# Patient Record
Sex: Male | Born: 1990 | Race: White | Hispanic: No | Marital: Single | Smoking: Former smoker
Health system: Southern US, Community
[De-identification: ages and names within clinical notes are randomized; demographics above are authoritative.]

## PROBLEM LIST (undated history)

## (undated) DIAGNOSIS — K8020 Calculus of gallbladder without cholecystitis without obstruction: Secondary | ICD-10-CM

## (undated) DIAGNOSIS — G43909 Migraine, unspecified, not intractable, without status migrainosus: Secondary | ICD-10-CM

## (undated) DIAGNOSIS — Z87442 Personal history of urinary calculi: Secondary | ICD-10-CM

## (undated) DIAGNOSIS — J189 Pneumonia, unspecified organism: Secondary | ICD-10-CM

---
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
## 2010-10-04 ENCOUNTER — Emergency Department (HOSPITAL_COMMUNITY): Admission: EM | Admit: 2010-10-04 | Discharge: 2010-04-10 | Payer: Self-pay | Admitting: Emergency Medicine

## 2011-03-15 NOTE — Op Note (Signed)
[place]  Patient:    Ryan Wilkins, Ryan Wilkins                      MRN: [id number] Adm. Date:  [id number] Disc. Date: [id number] Attending:  Ellwood Handler CC:         Fonnie Mu, M.D.   Operative Report  PREOPERATIVE DIAGNOSIS:  Obstructive voiding symptoms and meatal stenosis.  POSTOPERATIVE DIAGNOSIS:  Obstructive voiding symptoms and meatal stenosis.  PROCEDURE:  Meatotomy and pediatric cystoscopy.  ANESTHESIA:  General.  DRAINS:  None.  COMPLICATIONS:  None.  DESCRIPTION OF PROCEDURE:  The patient was prepped and draped in the dorsal lithotomy position after institution of an adequate level of general anesthesia.  The urethral meatus was dilated using pediatric sounds at the very tip, wide enough to admit the pediatric cystoscope which was well lubricated and gently inserted at the urethral meatus.  Normal penile and bulbous urethra.  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  Membranous urethra was active and intact.  Juvenile prostatic urethra nonobstructive.  Normal trigonal anatomy.  Split-like orifices in the ______ position, a fluxing clear urine.  Bladder showed no evidence of urethral abnormality, tumor stone, or anatomic deformity.  Bladder was drained, and cystoscope was gently withdrawn.  A fine pediatric hemostat was then placed at the urethral meatus on the ventral aspect, left in place for five minutes, and then released and replaced with an adult hemostat which was left in place for five minutes and then replaced and replaced with a pediatric needle driver which was left in place for five minutes and then released.  A thin strip of avascular tissue was then created at the ventral aspect, incised gently with the ______ scissors.  Adequate urethral meatus had been created.  Covered with triple antibiotic  ointment and a Telfa sponge, and patient was returned to recovery in satisfactory condition. DD:  03/13/01 TD:  03/15/01 Job: [id number]

## 2017-05-12 DIAGNOSIS — J019 Acute sinusitis, unspecified: Secondary | ICD-10-CM | POA: Diagnosis not present

## 2017-05-12 DIAGNOSIS — Z6825 Body mass index (BMI) 25.0-25.9, adult: Secondary | ICD-10-CM | POA: Diagnosis not present

## 2018-10-09 DIAGNOSIS — R05 Cough: Secondary | ICD-10-CM | POA: Diagnosis not present

## 2018-10-09 DIAGNOSIS — Z87891 Personal history of nicotine dependence: Secondary | ICD-10-CM | POA: Diagnosis not present

## 2018-10-09 DIAGNOSIS — J209 Acute bronchitis, unspecified: Secondary | ICD-10-CM | POA: Diagnosis not present

## 2019-02-03 DIAGNOSIS — J329 Chronic sinusitis, unspecified: Secondary | ICD-10-CM | POA: Diagnosis not present

## 2019-02-03 DIAGNOSIS — R509 Fever, unspecified: Secondary | ICD-10-CM | POA: Diagnosis not present

## 2019-02-03 DIAGNOSIS — R05 Cough: Secondary | ICD-10-CM | POA: Diagnosis not present

## 2019-02-03 DIAGNOSIS — H6691 Otitis media, unspecified, right ear: Secondary | ICD-10-CM | POA: Diagnosis not present

## 2019-02-03 DIAGNOSIS — H1089 Other conjunctivitis: Secondary | ICD-10-CM | POA: Diagnosis not present

## 2019-02-09 DIAGNOSIS — R509 Fever, unspecified: Secondary | ICD-10-CM | POA: Diagnosis not present

## 2019-02-09 DIAGNOSIS — H6691 Otitis media, unspecified, right ear: Secondary | ICD-10-CM | POA: Diagnosis not present

## 2019-02-09 DIAGNOSIS — J019 Acute sinusitis, unspecified: Secondary | ICD-10-CM | POA: Diagnosis not present

## 2019-02-09 DIAGNOSIS — H1089 Other conjunctivitis: Secondary | ICD-10-CM | POA: Diagnosis not present

## 2019-12-31 ENCOUNTER — Emergency Department (HOSPITAL_BASED_OUTPATIENT_CLINIC_OR_DEPARTMENT_OTHER)
Admission: EM | Admit: 2019-12-31 | Discharge: 2019-12-31 | Disposition: A | Discharge status: Home / Self Care | Payer: BC Managed Care – PPO | Attending: Emergency Medicine | Admitting: Emergency Medicine

## 2019-12-31 ENCOUNTER — Other Ambulatory Visit: Payer: Self-pay

## 2019-12-31 ENCOUNTER — Encounter (HOSPITAL_BASED_OUTPATIENT_CLINIC_OR_DEPARTMENT_OTHER): Payer: Self-pay | Admitting: Emergency Medicine

## 2019-12-31 ENCOUNTER — Encounter: Payer: Self-pay | Admitting: Gastroenterology

## 2019-12-31 ENCOUNTER — Emergency Department (HOSPITAL_BASED_OUTPATIENT_CLINIC_OR_DEPARTMENT_OTHER): Payer: BC Managed Care – PPO

## 2019-12-31 DIAGNOSIS — R1011 Right upper quadrant pain: Secondary | ICD-10-CM | POA: Diagnosis not present

## 2019-12-31 DIAGNOSIS — K824 Cholesterolosis of gallbladder: Secondary | ICD-10-CM | POA: Diagnosis not present

## 2019-12-31 DIAGNOSIS — R112 Nausea with vomiting, unspecified: Secondary | ICD-10-CM | POA: Insufficient documentation

## 2019-12-31 DIAGNOSIS — Z87891 Personal history of nicotine dependence: Secondary | ICD-10-CM | POA: Insufficient documentation

## 2019-12-31 DIAGNOSIS — R509 Fever, unspecified: Secondary | ICD-10-CM | POA: Insufficient documentation

## 2019-12-31 HISTORY — DX: Migraine, unspecified, not intractable, without status migrainosus: G43.909

## 2019-12-31 LAB — URINALYSIS, ROUTINE W REFLEX MICROSCOPIC
Bilirubin Urine: NEGATIVE
Glucose, UA: NEGATIVE mg/dL
Hgb urine dipstick: NEGATIVE
Ketones, ur: NEGATIVE mg/dL
Leukocytes,Ua: NEGATIVE
Nitrite: NEGATIVE
Protein, ur: NEGATIVE mg/dL
Specific Gravity, Urine: 1.02 (ref 1.005–1.030)
pH: 8.5 — ABNORMAL HIGH (ref 5.0–8.0)

## 2019-12-31 LAB — COMPREHENSIVE METABOLIC PANEL WITH GFR
ALT: 21 U/L (ref 0–44)
AST: 18 U/L (ref 15–41)
Albumin: 4.7 g/dL (ref 3.5–5.0)
Alkaline Phosphatase: 47 U/L (ref 38–126)
Anion gap: 8 (ref 5–15)
BUN: 12 mg/dL (ref 6–20)
CO2: 25 mmol/L (ref 22–32)
Calcium: 10 mg/dL (ref 8.9–10.3)
Chloride: 105 mmol/L (ref 98–111)
Creatinine, Ser: 1.09 mg/dL (ref 0.61–1.24)
GFR calc Af Amer: >60 mL/min
GFR calc non Af Amer: >60 mL/min
Glucose, Bld: 86 mg/dL (ref 70–99)
Potassium: 3.8 mmol/L (ref 3.5–5.1)
Sodium: 138 mmol/L (ref 135–145)
Total Bilirubin: 0.8 mg/dL (ref 0.3–1.2)
Total Protein: 7.7 g/dL (ref 6.5–8.1)

## 2019-12-31 LAB — CBC WITH DIFFERENTIAL/PLATELET
Abs Immature Granulocytes: 0.01 K/uL (ref 0.00–0.07)
Basophils Absolute: 0.1 K/uL (ref 0.0–0.1)
Basophils Relative: 1 %
Eosinophils Absolute: 0.3 K/uL (ref 0.0–0.5)
Eosinophils Relative: 4 %
HCT: 45.6 % (ref 39.0–52.0)
Hemoglobin: 16 g/dL (ref 13.0–17.0)
Immature Granulocytes: 0 %
Lymphocytes Relative: 36 %
Lymphs Abs: 2.9 K/uL (ref 0.7–4.0)
MCH: 30.6 pg (ref 26.0–34.0)
MCHC: 35.1 g/dL (ref 30.0–36.0)
MCV: 87.2 fL (ref 80.0–100.0)
Monocytes Absolute: 0.9 K/uL (ref 0.1–1.0)
Monocytes Relative: 11 %
Neutro Abs: 3.8 K/uL (ref 1.7–7.7)
Neutrophils Relative %: 48 %
Platelets: 255 K/uL (ref 150–400)
RBC: 5.23 MIL/uL (ref 4.22–5.81)
RDW: 12.6 % (ref 11.5–15.5)
WBC: 7.9 K/uL (ref 4.0–10.5)
nRBC: 0 % (ref 0.0–0.2)

## 2019-12-31 LAB — LIPASE, BLOOD: Lipase: 41 U/L (ref 11–51)

## 2019-12-31 MED ORDER — MORPHINE SULFATE (PF) 4 MG/ML IV SOLN
4.0000 mg | Freq: Once | INTRAVENOUS | Status: AC
  Administered 2019-12-31: 10:00:00 4 mg via INTRAVENOUS
  Filled 2019-12-31: qty 1

## 2019-12-31 MED ORDER — SODIUM CHLORIDE 0.9 % IV BOLUS
1000.0000 mL | Freq: Once | INTRAVENOUS | Status: AC
  Administered 2019-12-31: 1000 mL via INTRAVENOUS

## 2019-12-31 MED ORDER — ONDANSETRON HCL 4 MG/2ML IJ SOLN
4.0000 mg | Freq: Once | INTRAMUSCULAR | Status: AC
  Administered 2019-12-31: 4 mg via INTRAVENOUS
  Filled 2019-12-31: qty 2

## 2019-12-31 MED ORDER — ONDANSETRON 4 MG PO TBDP: 4.0000 mg | ORAL_TABLET | Freq: Three times a day (TID) | ORAL | 0 refills | Status: AC | PRN

## 2019-12-31 NOTE — Discharge Instructions (Signed)
Your labwork and ultrasound did not show any acute findings. No stones were seen on the ultrasound - you do have 1 small gallbladder polyp but this is likely not causing your symptoms.   Please follow up with [facility] for further evaluation - you will need to call to schedule an appointment  I have prescribed a short course of anti nausea medication to take as needed. If you have another episode of pain you can take 800 mg Ibuprofen. Your pain should subside within 4 hours. If not improvement in symptoms or if pain persists please return to the ED immediately.   Attached is information regarding your symptoms today. Please avoid fatty foods, spicy foods, or greasy foods as these can cause worsening pain.   Return to the ED IMMEDIATELY for any worsening symptoms including worsening pain, pain that lasts > 4 hours after Ibuprofen, excessive vomiting despite anti nausea medication, vomiting blood, fevers > 100.4, or any other concerning symptoms.

## 2019-12-31 NOTE — ED Triage Notes (Signed)
Middle upper abdominal pain that radiates to right and up into his back.  Started 2 days ago and is episodic.  Episodes put him into "a ball in the floor."  Vomited last night when it hit him.

## 2019-12-31 NOTE — ED Provider Notes (Signed)
[facility] EMERGENCY DEPARTMENT Provider Note   CSN: [id number] Arrival date & time: 12/31/19  0354     History Chief Complaint  Patient presents with  . Abdominal Pain    Ryan Wilkins is a 29 y.o. male who presents to the ED today complaining of sudden onset, intermittent, epigastric/RUQ abdominal pain x 2 days.  Patient endorses 2 days ago he had to leave for work early as he felt unwell.  He states he went home and checked his temperature and it was 99.9.  Patient states he rested throughout the day and took Tylenol to help bring his fever down.  He ate spaghetti with red meat sauce that night and approximately 2 to 3 hours afterwards started having severe pain in his epigastrium and right upper quadrant area.  Patient states the pain lasted approximately 2 hours and then dissipated without any intervention.  He states that he felt okay throughout the day yesterday however ate tacos from [employer] last night and immediately after eating the tacos had severe pain again.  Patient states that he had about 3-4 episodes of nonbloody nonbilious emesis all throughout the night and was in a fetal position due to the amount of pain he was in.  States the pain is somewhat improved since last night however he wants to come and see what is going on.  Patient states the pain will intermittently radiate through to his back and right underneath his right shoulder blade.  He has a history of kidney stones however states that this feels much different.  Denies any recent sick contacts.  Denies heavy alcohol use.  No recent antibiotic use.  Patient does report he felt like he had to have a bowel movement last night with a severe pain however was unable to do so.  Last normal bowel movement yesterday. Denies chills, chest pain, shortness of breath, coffee ground emesis, diarrhea, urinary symptoms, testicular pain, or any other associated symptoms.   The history is provided by the patient.        Past Medical History:  Diagnosis Date  . Migraines     There are no problems to display for this patient.   Past Surgical History:  Procedure Laterality Date  . BLADDER SURGERY    . TONSILLECTOMY         No family history on file.  Social History   Tobacco Use  . Smoking status: Former Games developer  . Smokeless tobacco: Never Used  Substance Use Topics  . Alcohol use: Not Currently  . Drug use: Yes    Types: Marijuana    Comment: last time 2 nights ago.    Home Medications Prior to Admission medications   Medication Sig Start Date End Date Taking? Authorizing Provider  ondansetron (ZOFRAN ODT) 4 MG disintegrating tablet Take 1 tablet (4 mg total) by mouth every 8 (eight) hours as needed for nausea or vomiting. 12/31/19   Tanda Rockers, PA-C    Allergies    Patient has no known allergies.  Review of Systems   Review of Systems  Constitutional: Positive for fever. Negative for chills and fatigue.  Respiratory: Negative for cough and shortness of breath.   Cardiovascular: Negative for chest pain.  Gastrointestinal: Positive for abdominal pain, nausea and vomiting. Negative for blood in stool, constipation and diarrhea.  Genitourinary: Negative for dysuria, flank pain and frequency.  All other systems reviewed and are negative.   Physical Exam Updated Vital Signs BP 128/79 (BP Location: Right Arm)  Pulse (!) 57   Temp (!) 97.5 F (36.4 C) (Oral)   Resp 16   Ht 5\' 10"  (1.778 m)   Wt 76.2 kg   SpO2 100%   BMI 24.12 kg/m   Physical Exam Vitals and nursing note reviewed.  Constitutional:      Appearance: He is not diaphoretic.     Comments: Uncomfortable appearing male  HENT:     Head: Normocephalic and atraumatic.  Eyes:     Conjunctiva/sclera: Conjunctivae normal.  Cardiovascular:     Rate and Rhythm: Normal rate and regular rhythm.     Heart sounds: Normal heart sounds.  Pulmonary:     Effort: Pulmonary effort is normal.     Breath sounds: Normal  breath sounds. No wheezing, rhonchi or rales.  Abdominal:     Palpations: Abdomen is soft.     Tenderness: There is abdominal tenderness in the right upper quadrant and epigastric area. There is no right CVA tenderness, left CVA tenderness, guarding (voluntary) or rebound. Positive signs include Murphy's sign.     Comments: Soft, + diffuse abdominal tenderness however worse in the epigastrium and RUQ area, no rebound or guarding, + murphy's,  +BS throughout, neg mcburney's, no CVA TTP  Musculoskeletal:     Cervical back: Neck supple.  Skin:    General: Skin is warm and dry.  Neurological:     Mental Status: He is alert.     ED Results / Procedures / Treatments   Labs (all labs ordered are listed, but only abnormal results are displayed) Labs Reviewed  URINALYSIS, ROUTINE W REFLEX MICROSCOPIC - Abnormal; Notable for the following components:      Result Value   APPearance CLOUDY (*)    pH 8.5 (*)    All other components within normal limits  COMPREHENSIVE METABOLIC PANEL  CBC WITH DIFFERENTIAL/PLATELET  LIPASE, BLOOD    EKG None  Radiology Abdomen Limited RUQ  Result Date: 12/31/2019 CLINICAL DATA:  Right upper quadrant pain EXAM: ULTRASOUND ABDOMEN LIMITED RIGHT UPPER QUADRANT COMPARISON:  None. FINDINGS: Gallbladder: 4.5 mm gallbladder polyp. No gallstone or gallbladder wall thickening. Positive sonographic Murphy sign Common bile duct: Diameter: 1.7 mm Liver: No focal lesion identified. Within normal limits in parenchymal echogenicity. Portal vein is patent on color Doppler imaging with normal direction of blood flow towards the liver. Other: None. IMPRESSION: The patient is tender over the gallbladder. There is no gallbladder wall thickening or gallstones. There is a small gallbladder polyp. Electronically Signed   By: 03/01/2020 M.D.   On: 12/31/2019 11:02    Procedures Procedures (including critical care time)  Medications Ordered in ED Medications  sodium  chloride 0.9 % bolus 1,000 mL (0 mLs Intravenous Stopped 12/31/19 1131)  ondansetron (ZOFRAN) injection 4 mg (4 mg Intravenous Given 12/31/19 1010)  morphine 4 MG/ML injection 4 mg (4 mg Intravenous Given 12/31/19 1010)    ED Course  I have reviewed the triage vital signs and the nursing notes.  Pertinent labs & imaging results that were available during my care of the patient were reviewed by me and considered in my medical decision making (see chart for details).  Clinical Course as of Dec 31 1130  Fri Dec 31, 2019  1030 WBC: 7.9 [MV]    Clinical Course User Index [MV] Jan 02, 2020, Tanda Rockers   29 year old male presenting to the ED with 2 days of intermittent epigastric/RUQ abdominal pain radiating to underneath right shoulder blade. Symptoms first began after eating  spaghetti with red sauce and then again yesterday after tacos from [employer]. On arrival to the ED pt is afebrile, nontachycardic, and nontachypneic. He appears uncomfortable on exam due to pain however nontoxic appearing today. + RUQ TTP with positive Murphy's sign. Symptoms suspicion for gallbladder etiology. Pt denies heavy alcohol use, doubt pancreatitis today. May consider PUD as well. Pt has hx of kidney stones however states this feels different. No CVA tenderness on exam. No focal lower abdominal pain. Will obtain screening labs at this time and RUQ ultrasound. IVFs, zofran, and morphine given for symptomatic relief.   CBC without leukocytosis. Hgb stable.  CMP without electrolyte abnormalities. Creatinine 1.09 however do not have baseline to compare to, GFR > 60. No LFT elevation.  Lipase 41.  U/A cloudy in appearance, no signs of Hgb to suggest kidney stones. No infection.   Ultrasound with gallbladder polyp however no signs for stones or sludge. No gallbladder wall thickening. Normal CBD size.   On reevaluation pt states the pain is improved with pain medication. He is resting comfortably after IVFs. Have discussed  findings with patient. Am still suspicious for gallbladder etiology and will have patient follow up with GI for further evaluation. May benefit from a HIDA scan. Have discussed avoidance of fatty/greasy or spicy foods with patient. Will prescribe Zofran PRN for symptoms and have advised Ibuprofen if pain returns. Strict return precautions have been discussed. Pt is in agreement with plan and stable for discharge home.   This note was prepared using Dragon voice recognition software and may include unintentional dictation errors due to the inherent limitations of voice recognition software.  MDM Rules/Calculators/A&P                       Final Clinical Impression(s) / ED Diagnoses Final diagnoses:  RUQ abdominal pain  Non-intractable vomiting with nausea, unspecified vomiting type    Rx / DC Orders ED Discharge Orders         Ordered    ondansetron (ZOFRAN ODT) 4 MG disintegrating tablet  Every 8 hours PRN     12/31/19 1126           Discharge Instructions     Your labwork and ultrasound did not show any acute findings. No stones were seen on the ultrasound - you do have 1 small gallbladder polyp but this is likely not causing your symptoms.   Please follow up with [facility] for further evaluation - you will need to call to schedule an appointment  I have prescribed a short course of anti nausea medication to take as needed. If you have another episode of pain you can take 800 mg Ibuprofen. Your pain should subside within 4 hours. If not improvement in symptoms or if pain persists please return to the ED immediately.   Attached is information regarding your symptoms today. Please avoid fatty foods, spicy foods, or greasy foods as these can cause worsening pain.   Return to the ED IMMEDIATELY for any worsening symptoms including worsening pain, pain that lasts > 4 hours after Ibuprofen, excessive vomiting despite anti nausea medication, vomiting blood, fevers > 100.4,  or any other concerning symptoms.        Eustaquio Maize, PA-C 12/31/19 1132    Isla Pence, MD 12/31/19 1258

## 2020-01-01 ENCOUNTER — Emergency Department (HOSPITAL_COMMUNITY)
Admission: EM | Admit: 2020-01-01 | Discharge: 2020-01-02 | Disposition: A | Discharge status: Home / Self Care | Payer: BC Managed Care – PPO | Attending: Emergency Medicine | Admitting: Emergency Medicine

## 2020-01-01 ENCOUNTER — Other Ambulatory Visit: Payer: Self-pay

## 2020-01-01 ENCOUNTER — Encounter (HOSPITAL_COMMUNITY): Payer: Self-pay | Admitting: *Deleted

## 2020-01-01 DIAGNOSIS — R112 Nausea with vomiting, unspecified: Secondary | ICD-10-CM | POA: Insufficient documentation

## 2020-01-01 DIAGNOSIS — F1210 Cannabis abuse, uncomplicated: Secondary | ICD-10-CM | POA: Diagnosis not present

## 2020-01-01 DIAGNOSIS — Z87891 Personal history of nicotine dependence: Secondary | ICD-10-CM | POA: Diagnosis not present

## 2020-01-01 DIAGNOSIS — R1013 Epigastric pain: Secondary | ICD-10-CM | POA: Diagnosis not present

## 2020-01-01 LAB — CBC
HCT: 44.7 % (ref 39.0–52.0)
Hemoglobin: 15.6 g/dL (ref 13.0–17.0)
MCH: 30.6 pg (ref 26.0–34.0)
MCHC: 34.9 g/dL (ref 30.0–36.0)
MCV: 87.6 fL (ref 80.0–100.0)
Platelets: 255 10*3/uL (ref 150–400)
RBC: 5.1 MIL/uL (ref 4.22–5.81)
RDW: 12.3 % (ref 11.5–15.5)
WBC: 7.6 10*3/uL (ref 4.0–10.5)
nRBC: 0 % (ref 0.0–0.2)

## 2020-01-01 LAB — COMPREHENSIVE METABOLIC PANEL
ALT: 19 U/L (ref 0–44)
AST: 20 U/L (ref 15–41)
Albumin: 4.5 g/dL (ref 3.5–5.0)
Alkaline Phosphatase: 46 U/L (ref 38–126)
Anion gap: 10 (ref 5–15)
BUN: 11 mg/dL (ref 6–20)
CO2: 27 mmol/L (ref 22–32)
Calcium: 9.7 mg/dL (ref 8.9–10.3)
Chloride: 103 mmol/L (ref 98–111)
Creatinine, Ser: 1.36 mg/dL — ABNORMAL HIGH (ref 0.61–1.24)
GFR calc Af Amer: >60 mL/min (ref >60)
GFR calc non Af Amer: >60 mL/min (ref >60)
Glucose, Bld: 107 mg/dL — ABNORMAL HIGH (ref 70–99)
Potassium: 4.3 mmol/L (ref 3.5–5.1)
Sodium: 140 mmol/L (ref 135–145)
Total Bilirubin: 0.7 mg/dL (ref 0.3–1.2)
Total Protein: 7 g/dL (ref 6.5–8.1)

## 2020-01-01 LAB — URINALYSIS, ROUTINE W REFLEX MICROSCOPIC
Bilirubin Urine: NEGATIVE
Glucose, UA: NEGATIVE mg/dL
Hgb urine dipstick: NEGATIVE
Ketones, ur: NEGATIVE mg/dL
Leukocytes,Ua: NEGATIVE
Nitrite: NEGATIVE
Protein, ur: NEGATIVE mg/dL
Specific Gravity, Urine: 1.018 (ref 1.005–1.030)
pH: 6 (ref 5.0–8.0)

## 2020-01-01 LAB — LIPASE, BLOOD: Lipase: 53 U/L — ABNORMAL HIGH (ref 11–51)

## 2020-01-01 MED ORDER — SODIUM CHLORIDE 0.9% FLUSH: 3.0000 mL | Freq: Once | INTRAVENOUS | Status: DC

## 2020-01-01 NOTE — ED Triage Notes (Signed)
Upper abdominal pain, associated with n/v/d. Seen at [facility] for the same yesterday, has appt to see surgery in April.

## 2020-01-02 ENCOUNTER — Emergency Department (HOSPITAL_COMMUNITY): Payer: BC Managed Care – PPO

## 2020-01-02 DIAGNOSIS — R1110 Vomiting, unspecified: Secondary | ICD-10-CM | POA: Diagnosis not present

## 2020-01-02 MED ORDER — METOCLOPRAMIDE HCL 5 MG/ML IJ SOLN
10.0000 mg | Freq: Once | INTRAMUSCULAR | Status: AC
  Administered 2020-01-02: 10 mg via INTRAVENOUS
  Filled 2020-01-02: qty 2

## 2020-01-02 MED ORDER — HYDROMORPHONE HCL 1 MG/ML IJ SOLN: 0.5000 mg | Freq: Once | INTRAMUSCULAR | Status: DC

## 2020-01-02 MED ORDER — IOHEXOL 300 MG/ML  SOLN
100.0000 mL | Freq: Once | INTRAMUSCULAR | Status: AC | PRN
  Administered 2020-01-02: 100 mL via INTRAVENOUS

## 2020-01-02 MED ORDER — ALUM & MAG HYDROXIDE-SIMETH 200-200-20 MG/5ML PO SUSP
30.0000 mL | Freq: Once | ORAL | Status: AC
  Administered 2020-01-02: 05:00:00 30 mL via ORAL
  Filled 2020-01-02: qty 30

## 2020-01-02 MED ORDER — OMEPRAZOLE 20 MG PO CPDR: 20.0000 mg | DELAYED_RELEASE_CAPSULE | Freq: Every day | ORAL | 0 refills | Status: DC

## 2020-01-02 MED ORDER — SUCRALFATE 1 G PO TABS: 1.0000 g | ORAL_TABLET | Freq: Three times a day (TID) | ORAL | 0 refills | Status: DC

## 2020-01-02 MED ORDER — LIDOCAINE VISCOUS HCL 2 % MT SOLN
15.0000 mL | Freq: Once | OROMUCOSAL | Status: AC
  Administered 2020-01-02: 15 mL via ORAL
  Filled 2020-01-02: qty 15

## 2020-01-02 NOTE — Discharge Instructions (Addendum)
Your lab tests and CT of your abdomen are all within normal limits. There is no evidence of inflammation, perforation, or infection.   Please follow up with Dr. Matthias Hughs (gastroenterology) to discuss further evaluation of your pain. Take the medications as prescribed for further pain relief. Continue Zofran for nausea.

## 2020-01-02 NOTE — ED Provider Notes (Signed)
[facility] EMERGENCY DEPARTMENT Provider Note   CSN: [id number] Arrival date & time: 01/01/20  2241     History Chief Complaint  Patient presents with  . Abdominal Pain    Ryan Wilkins is a 29 y.o. male.  Patient without significant medical history presents with epigastric abdominal pain for the past 4 days. The pain is worse after eating or drinking anything. He denies history of similar recurrent pain. No fever. He has had nausea and vomiting without hematemesis. He denies alcohol use, drug use or regular use of NSAIDs. He continues to have normal bowel movements. No chest pain or SOB. No excessive belching or flatulence. He denies previous abdominal surgeries. He was seen at [facility] 12/31/19 for same and reports he had an ultrasound for evaluation of gall stones. On discharge he was referred to surgery and has a pending appointment in April.   The history is provided by the patient. No language interpreter was used.  Abdominal Pain Associated symptoms: nausea and vomiting   Associated symptoms: no constipation, no diarrhea and no fever        Past Medical History:  Diagnosis Date  . Migraines     There are no problems to display for this patient.   Past Surgical History:  Procedure Laterality Date  . BLADDER SURGERY    . TONSILLECTOMY         No family history on file.  Social History   Tobacco Use  . Smoking status: Former Research scientist (life sciences)  . Smokeless tobacco: Never Used  Substance Use Topics  . Alcohol use: Not Currently  . Drug use: Yes    Types: Marijuana    Comment: last time 2 nights ago.    Home Medications Prior to Admission medications   Medication Sig Start Date End Date Taking? Authorizing Provider  ondansetron (ZOFRAN ODT) 4 MG disintegrating tablet Take 1 tablet (4 mg total) by mouth every 8 (eight) hours as needed for nausea or vomiting. 12/31/19   Eustaquio Maize, PA-C    Allergies    Patient has no known  allergies.  Review of Systems   Review of Systems  Constitutional: Negative for fever.  Respiratory: Negative.   Cardiovascular: Negative.   Gastrointestinal: Positive for abdominal pain, nausea and vomiting. Negative for constipation and diarrhea.  Genitourinary: Negative.  Negative for decreased urine volume.    Physical Exam Updated Vital Signs BP 119/69 (BP Location: Left Arm)   Pulse (!) 53   Temp 98.4 F (36.9 C) (Oral)   Resp 19   SpO2 100%   Physical Exam Vitals and nursing note reviewed.  Constitutional:      Appearance: He is well-developed.  HENT:     Head: Normocephalic.  Cardiovascular:     Rate and Rhythm: Normal rate and regular rhythm.  Pulmonary:     Effort: Pulmonary effort is normal.     Breath sounds: Normal breath sounds.  Abdominal:     General: Bowel sounds are decreased. There is no distension.     Palpations: Abdomen is soft.     Tenderness: There is abdominal tenderness in the right upper quadrant, epigastric area and left upper quadrant. There is guarding. There is no rebound.  Musculoskeletal:        General: Normal range of motion.     Cervical back: Normal range of motion and neck supple.  Skin:    General: Skin is warm and dry.     Findings: No rash.  Neurological:  Mental Status: He is alert and oriented to person, place, and time.     ED Results / Procedures / Treatments   Labs (all labs ordered are listed, but only abnormal results are displayed) Labs Reviewed  LIPASE, BLOOD - Abnormal; Notable for the following components:      Result Value   Lipase 53 (*)    All other components within normal limits  COMPREHENSIVE METABOLIC PANEL - Abnormal; Notable for the following components:   Glucose, Bld 107 (*)    Creatinine, Ser 1.36 (*)    All other components within normal limits  CBC  URINALYSIS, ROUTINE W REFLEX MICROSCOPIC    EKG None  Radiology US Abdomen Limited RUQ  Result Date: 12/31/2019 CLINICAL DATA:  Right  upper quadrant pain EXAM: ULTRASOUND ABDOMEN LIMITED RIGHT UPPER QUADRANT COMPARISON:  None. FINDINGS: Gallbladder: 4.5 mm gallbladder polyp. No gallstone or gallbladder wall thickening. Positive sonographic Murphy sign Common bile duct: Diameter: 1.7 mm Liver: No focal lesion identified. Within normal limits in parenchymal echogenicity. Portal vein is patent on color Doppler imaging with normal direction of blood flow towards the liver. Other: None. IMPRESSION: The patient is tender over the gallbladder. There is no gallbladder wall thickening or gallstones. There is a small gallbladder polyp. Electronically Signed   By: Marlan Palau M.D.   On: 12/31/2019 11:02    Procedures Procedures (including critical care time)  Medications Ordered in ED Medications  sodium chloride flush (NS) 0.9 % injection 3 mL (has no administration in time range)    ED Course  I have reviewed the triage vital signs and the nursing notes.  Pertinent labs & imaging results that were available during my care of the patient were reviewed by me and considered in my medical decision making (see chart for details).    MDM Rules/Calculators/A&P                      Patient to ED for evaluation of epigastric abdominal pain as detailed in the HPI.   VSS. Chart reviewed. Labs on 3/5 at [facility] nonconcerning. US performed at that visit did not show gall stones. There was a small Gall Bladder polyp.   Tonight on exam, he is markedly tender. Still no leukocytosis or fever. Lipase is slightly elevated. Will obtain cT scan for further evaluation.   CT essentially unremarkable. Pain is controlled in the ED. Will start on Prilosec and Carafate and refer to GI for further evaluation.   Final Clinical Impression(s) / ED Diagnoses Final diagnoses:  None   1. Epigastric pain  Rx / DC Orders ED Discharge Orders    None       Elpidio Anis, PA-C 01/03/20 0603    Gilda Crease, MD 01/03/20 [phone]

## 2020-01-02 NOTE — ED Notes (Signed)
Pt was in lobby eating and drinking from vending  machine. No vomiting was seen after.

## 2020-01-03 DIAGNOSIS — R1011 Right upper quadrant pain: Secondary | ICD-10-CM | POA: Diagnosis not present

## 2020-01-03 DIAGNOSIS — R110 Nausea: Secondary | ICD-10-CM | POA: Diagnosis not present

## 2020-01-10 ENCOUNTER — Ambulatory Visit: Payer: Self-pay | Admitting: General Surgery

## 2020-01-10 DIAGNOSIS — K8020 Calculus of gallbladder without cholecystitis without obstruction: Secondary | ICD-10-CM | POA: Diagnosis not present

## 2020-01-10 NOTE — H&P (View-Only) (Signed)
History of Present Illness Axel Filler MD; 01/10/2020 3:56 PM) The patient is a 29 year old male who presents for evaluation of gall stones. Chief Complaint: Abdominal pain  Patient is a 29 year old male comes in with significant abdominal pain began approximately one half weeks ago. He states that the pain was epigastric and right upper quadrant. Patient was seen in the ER underwent ultrasound laboratory studies. Ultrasound revealed no gallstones however he did have a 4.5 mm polyp. Patient's LFTs were within normal. I did review his ultrasound and laboratory studies personally.  Patient states that since that time is continue with abdominal pain, nausea, vomiting, minimal by mouth intake. Patient states he's lost approximately 15 pounds.  He's had no previous abdominal surgery.    Past Surgical History Micheal Likens, CMA; 01/10/2020 3:35 PM) Oral Surgery   Diagnostic Studies History [facility], [place]; 01/10/2020 3:35 PM) Colonoscopy  never  Allergies (Chanel [facility], CMA; 01/10/2020 3:36 PM) No Known Allergies [01/10/2020]: No Known Drug Allergies [01/10/2020]: Allergies Reconciled   Medication History (Chanel Lon[facility] Fix, CMA; 01/10/2020 3:36 PM) Omeprazole (20MG  Capsule DR, Oral) Active. Sucralfate (1GM Tablet, Oral) Active. Percocet (Oral) Specific strength unknown - Active. Ondansetron (4MG  Tablet Disint, Oral) Active. Medications Reconciled  Social History , CMA; 01/10/2020 3:35 PM) Alcohol use  Occasional alcohol use. Caffeine use  Carbonated beverages. No drug use  Tobacco use  Former smoker.  Family History Micheal Likens, CMA; 01/10/2020 3:35 PM) Breast Cancer  Mother. Diabetes Mellitus  Father.  Other Problems Micheal Likens, CMA; 01/10/2020 3:35 PM) Bladder Problems  Kidney Stone  Migraine Headache     Review of Systems Micheal Likens MD; 01/10/2020 3:55 PM) General Present- Appetite Loss, Chills, Fatigue and Weight Loss. Not  Present- Fever, Night Sweats and Weight Gain. Skin Not Present- Change in Wart/Mole, Dryness, Hives, Jaundice, New Lesions, Non-Healing Wounds, Rash and Ulcer. HEENT Present- Seasonal Allergies. Not Present- Earache, Hearing Loss, Hoarseness, Nose Bleed, Oral Ulcers, Ringing in the Ears, Sinus Pain, Sore Throat, Visual Disturbances, Wears glasses/contact lenses and Yellow Eyes. Respiratory Not Present- Bloody sputum, Chronic Cough, Difficulty Breathing, Snoring and Wheezing. Breast Not Present- Breast Mass, Breast Pain, Nipple Discharge and Skin Changes. Cardiovascular Not Present- Chest Pain, Difficulty Breathing Lying Down, Leg Cramps, Palpitations, Rapid Heart Rate, Shortness of Breath and Swelling of Extremities. Gastrointestinal Present- Abdominal Pain, Bloating, Change in Bowel Habits, Constipation, Gets full quickly at meals, Nausea and Vomiting. Not Present- Bloody Stool, Chronic diarrhea, Difficulty Swallowing, Excessive gas, Hemorrhoids, Indigestion and Rectal Pain. Male Genitourinary Not Present- Blood in Urine, Change in Urinary Stream, Frequency, Impotence, Nocturia, Painful Urination, Urgency and Urine Leakage. Musculoskeletal Present- Back Pain. Not Present- Joint Pain, Joint Stiffness, Muscle Pain, Muscle Weakness and Swelling of Extremities. Neurological Not Present- Decreased Memory, Fainting, Headaches, Numbness, Seizures, Tingling, Tremor, Trouble walking and Weakness. Psychiatric Present- Change in Sleep Pattern. Not Present- Anxiety, Bipolar, Depression, Fearful and Frequent crying. Endocrine Not Present- Cold Intolerance, Excessive Hunger, Hair Changes, Heat Intolerance, Hot flashes and New Diabetes. Hematology Not Present- Blood Thinners, Easy Bruising, Excessive bleeding, Gland problems, HIV and Persistent Infections. All other systems negative  Vitals (Chanel Nolan CMA; 01/10/2020 3:37 PM) 01/10/2020 3:36 PM Weight: 164.5 lb Height: 70in Body Surface Area: 1.92 m Body  Mass Index: 23.6 kg/m  Temp.: 98.1F  Pulse: 74 (Regular)  BP: 124/76 (Sitting, Left Arm, Standard)       Physical Exam 01/12/2020 MD; 01/10/2020 3:57 PM) The physical exam findings are as follows: Note:Constitutional: No acute distress, conversant, appears stated  age  Eyes: Anicteric sclerae, moist conjunctiva, no lid lag  Neck: No thyromegaly, trachea midline, no cervical lymphadenopathy  Lungs: Clear to auscultation biilaterally, normal respiratory effot  Cardiovascular: regular rate & rhythm, no murmurs, no peripheal edema, pedal pulses 2+  GI: Soft, no masses or hepatosplenomegaly, tender to palpation to right upper quadrant  MSK: Normal gait, no clubbing cyanosis, edema  Skin: No rashes, palpation reveals normal skin turgor  Psychiatric: Appropriate judgment and insight, oriented to person, place, and time    Assessment & Plan Ralene Ok MD; 01/10/2020 3:59 PM) SYMPTOMATIC CHOLELITHIASIS (K80.20) Impression: 29 year old male with biliary dyskinesia versus acute cholecystitis. 1. We will proceed to the operating room for a laparoscopic cholecystectomy  2. Risks and benefits were discussed with the patient to generally include, but not limited to: infection, bleeding, possible need for post op ERCP, damage to the bile ducts, bile leak, and possible need for further surgery. Alternatives were offered and described. All questions were answered and the patient voiced understanding of the procedure and wishes to proceed at this point with a laparoscopic cholecystectomy  I reviewed the patient's external notes from the referring physicians as well as consulting physician team. Each of the radiologic studies and lab studies were independently reviewed and interpreted. I discussed the results of the above studies and how they relate to the patient's surgical problems.

## 2020-01-10 NOTE — H&P (Signed)
History of Present Illness Ryan Filler MD; 01/10/2020 3:56 PM) The patient is a 29 year old male who presents for evaluation of gall stones. Chief Complaint: Abdominal pain  Patient is a 29 year old male comes in with significant abdominal pain began approximately one half weeks ago. He states that the pain was epigastric and right upper quadrant. Patient was seen in the ER underwent ultrasound laboratory studies. Ultrasound revealed no gallstones however he did have a 4.5 mm polyp. Patient's LFTs were within normal. I did review his ultrasound and laboratory studies personally.  Patient states that since that time is continue with abdominal pain, nausea, vomiting, minimal by mouth intake. Patient states he's lost approximately 15 pounds.  He's had no previous abdominal surgery.    Past Surgical History Micheal Likens, CMA; 01/10/2020 3:35 PM) Oral Surgery   Diagnostic Studies History [facility], [place]; 01/10/2020 3:35 PM) Colonoscopy  never  Allergies (Chanel [facility], CMA; 01/10/2020 3:36 PM) No Known Allergies [01/10/2020]: No Known Drug Allergies [01/10/2020]: Allergies Reconciled   Medication History (Chanel [facility] Fix, CMA; 01/10/2020 3:36 PM) Omeprazole (20MG  Capsule DR, Oral) Active. Sucralfate (1GM Tablet, Oral) Active. Percocet (Oral) Specific strength unknown - Active. Ondansetron (4MG  Tablet Disint, Oral) Active. Medications Reconciled  Social History , CMA; 01/10/2020 3:35 PM) Alcohol use  Occasional alcohol use. Caffeine use  Carbonated beverages. No drug use  Tobacco use  Former smoker.  Family History Micheal Likens, CMA; 01/10/2020 3:35 PM) Breast Cancer  Mother. Diabetes Mellitus  Father.  Other Problems Micheal Likens, CMA; 01/10/2020 3:35 PM) Bladder Problems  Kidney Stone  Migraine Headache     Review of Systems Micheal Likens MD; 01/10/2020 3:55 PM) General Present- Appetite Loss, Chills, Fatigue and Weight Loss. Not  Present- Fever, Night Sweats and Weight Gain. Skin Not Present- Change in Wart/Mole, Dryness, Hives, Jaundice, New Lesions, Non-Healing Wounds, Rash and Ulcer. HEENT Present- Seasonal Allergies. Not Present- Earache, Hearing Loss, Hoarseness, Nose Bleed, Oral Ulcers, Ringing in the Ears, Sinus Pain, Sore Throat, Visual Disturbances, Wears glasses/contact lenses and Yellow Eyes. Respiratory Not Present- Bloody sputum, Chronic Cough, Difficulty Breathing, Snoring and Wheezing. Breast Not Present- Breast Mass, Breast Pain, Nipple Discharge and Skin Changes. Cardiovascular Not Present- Chest Pain, Difficulty Breathing Lying Down, Leg Cramps, Palpitations, Rapid Heart Rate, Shortness of Breath and Swelling of Extremities. Gastrointestinal Present- Abdominal Pain, Bloating, Change in Bowel Habits, Constipation, Gets full quickly at meals, Nausea and Vomiting. Not Present- Bloody Stool, Chronic diarrhea, Difficulty Swallowing, Excessive gas, Hemorrhoids, Indigestion and Rectal Pain. Male Genitourinary Not Present- Blood in Urine, Change in Urinary Stream, Frequency, Impotence, Nocturia, Painful Urination, Urgency and Urine Leakage. Musculoskeletal Present- Back Pain. Not Present- Joint Pain, Joint Stiffness, Muscle Pain, Muscle Weakness and Swelling of Extremities. Neurological Not Present- Decreased Memory, Fainting, Headaches, Numbness, Seizures, Tingling, Tremor, Trouble walking and Weakness. Psychiatric Present- Change in Sleep Pattern. Not Present- Anxiety, Bipolar, Depression, Fearful and Frequent crying. Endocrine Not Present- Cold Intolerance, Excessive Hunger, Hair Changes, Heat Intolerance, Hot flashes and New Diabetes. Hematology Not Present- Blood Thinners, Easy Bruising, Excessive bleeding, Gland problems, HIV and Persistent Infections. All other systems negative  Vitals (Chanel Nolan CMA; 01/10/2020 3:37 PM) 01/10/2020 3:36 PM Weight: 164.5 lb Height: 70in Body Surface Area: 1.92 m Body  Mass Index: 23.6 kg/m  Temp.: 98.1F  Pulse: 74 (Regular)  BP: 124/76 (Sitting, Left Arm, Standard)       Physical Exam 01/12/2020 MD; 01/10/2020 3:57 PM) The physical exam findings are as follows: Note:Constitutional: No acute distress, conversant, appears stated  age  Eyes: Anicteric sclerae, moist conjunctiva, no lid lag  Neck: No thyromegaly, trachea midline, no cervical lymphadenopathy  Lungs: Clear to auscultation biilaterally, normal respiratory effot  Cardiovascular: regular rate & rhythm, no murmurs, no peripheal edema, pedal pulses 2+  GI: Soft, no masses or hepatosplenomegaly, tender to palpation to right upper quadrant  MSK: Normal gait, no clubbing cyanosis, edema  Skin: No rashes, palpation reveals normal skin turgor  Psychiatric: Appropriate judgment and insight, oriented to person, place, and time    Assessment & Plan (Lillyan Hitson MD; 01/10/2020 3:59 PM) SYMPTOMATIC CHOLELITHIASIS (K80.20) Impression: 28-year-old male with biliary dyskinesia versus acute cholecystitis. 1. We will proceed to the operating room for a laparoscopic cholecystectomy  2. Risks and benefits were discussed with the patient to generally include, but not limited to: infection, bleeding, possible need for post op ERCP, damage to the bile ducts, bile leak, and possible need for further surgery. Alternatives were offered and described. All questions were answered and the patient voiced understanding of the procedure and wishes to proceed at this point with a laparoscopic cholecystectomy  I reviewed the patient's external notes from the referring physicians as well as consulting physician team. Each of the radiologic studies and lab studies were independently reviewed and interpreted. I discussed the results of the above studies and how they relate to the patient's surgical problems. 

## 2020-01-14 ENCOUNTER — Other Ambulatory Visit (HOSPITAL_COMMUNITY)
Admission: RE | Admit: 2020-01-14 | Discharge: 2020-01-14 | Disposition: A | Discharge status: Home / Self Care | Payer: BC Managed Care – PPO | Source: Ambulatory Visit | Attending: General Surgery | Admitting: General Surgery

## 2020-01-14 DIAGNOSIS — Z20822 Contact with and (suspected) exposure to covid-19: Secondary | ICD-10-CM | POA: Insufficient documentation

## 2020-01-14 DIAGNOSIS — Z01812 Encounter for preprocedural laboratory examination: Secondary | ICD-10-CM | POA: Insufficient documentation

## 2020-01-14 LAB — SARS CORONAVIRUS 2 (TAT 6-24 HRS): SARS Coronavirus 2: NEGATIVE

## 2020-01-17 ENCOUNTER — Encounter (HOSPITAL_COMMUNITY): Payer: Self-pay | Admitting: General Surgery

## 2020-01-17 ENCOUNTER — Other Ambulatory Visit: Payer: Self-pay

## 2020-01-17 NOTE — Progress Notes (Signed)
Pt stated that he is under the care of Dr. Guerry Bruin, PCP.

## 2020-01-17 NOTE — Progress Notes (Signed)
Pt denies SOB, chest pain, and being under the care of a cardiologist. Pt denies having a stress test and cardiac cath. Pt denies having an EKG and chest x ray in the last year. Pt denies having labs since 01/01/20. Pt made aware to stop  taking Aspirin (unless otherwise advised by surgeon), vitamins, fish oil and herbal medications. Do not take any NSAIDs ie: Ibuprofen, Advil, Naproxen (Aleve), Motrin, Excedrin Migraine,  BC and Goody Powder. Pt reminded to quarantine. Pt verbalized understanding of all pre-op instructions.

## 2020-01-18 ENCOUNTER — Ambulatory Visit (HOSPITAL_COMMUNITY): Payer: BC Managed Care – PPO

## 2020-01-18 ENCOUNTER — Encounter (HOSPITAL_COMMUNITY)
Admission: RE | Disposition: A | Discharge status: Home / Self Care | Payer: Self-pay | Source: Home / Self Care | Attending: General Surgery

## 2020-01-18 ENCOUNTER — Encounter (HOSPITAL_COMMUNITY): Payer: Self-pay | Admitting: General Surgery

## 2020-01-18 ENCOUNTER — Ambulatory Visit (HOSPITAL_COMMUNITY)
Admission: RE | Admit: 2020-01-18 | Discharge: 2020-01-18 | Disposition: A | Discharge status: Home / Self Care | Payer: BC Managed Care – PPO | Attending: General Surgery | Admitting: General Surgery

## 2020-01-18 DIAGNOSIS — Z87442 Personal history of urinary calculi: Secondary | ICD-10-CM | POA: Diagnosis not present

## 2020-01-18 DIAGNOSIS — G43909 Migraine, unspecified, not intractable, without status migrainosus: Secondary | ICD-10-CM | POA: Diagnosis not present

## 2020-01-18 DIAGNOSIS — Z87891 Personal history of nicotine dependence: Secondary | ICD-10-CM | POA: Insufficient documentation

## 2020-01-18 DIAGNOSIS — Z6822 Body mass index (BMI) 22.0-22.9, adult: Secondary | ICD-10-CM | POA: Insufficient documentation

## 2020-01-18 DIAGNOSIS — Z833 Family history of diabetes mellitus: Secondary | ICD-10-CM | POA: Insufficient documentation

## 2020-01-18 DIAGNOSIS — R634 Abnormal weight loss: Secondary | ICD-10-CM | POA: Insufficient documentation

## 2020-01-18 DIAGNOSIS — K828 Other specified diseases of gallbladder: Secondary | ICD-10-CM | POA: Insufficient documentation

## 2020-01-18 DIAGNOSIS — M549 Dorsalgia, unspecified: Secondary | ICD-10-CM | POA: Diagnosis not present

## 2020-01-18 DIAGNOSIS — K811 Chronic cholecystitis: Secondary | ICD-10-CM | POA: Insufficient documentation

## 2020-01-18 DIAGNOSIS — Z803 Family history of malignant neoplasm of breast: Secondary | ICD-10-CM | POA: Insufficient documentation

## 2020-01-18 HISTORY — DX: Calculus of gallbladder without cholecystitis without obstruction: K80.20

## 2020-01-18 HISTORY — PX: CHOLECYSTECTOMY: SHX55

## 2020-01-18 HISTORY — DX: Pneumonia, unspecified organism: J18.9

## 2020-01-18 HISTORY — DX: Personal history of urinary calculi: Z87.442

## 2020-01-18 SURGERY — LAPAROSCOPIC CHOLECYSTECTOMY
Anesthesia: General | Site: Abdomen

## 2020-01-18 MED ORDER — CEFAZOLIN SODIUM-DEXTROSE 2-4 GM/100ML-% IV SOLN
INTRAVENOUS | Status: AC
  Filled 2020-01-18: qty 100

## 2020-01-18 MED ORDER — PROPOFOL 10 MG/ML IV BOLUS
INTRAVENOUS | Status: DC | PRN
  Administered 2020-01-18: 20 mg via INTRAVENOUS

## 2020-01-18 MED ORDER — OXYCODONE HCL 5 MG/5ML PO SOLN: 5.0000 mg | Freq: Once | ORAL | Status: AC | PRN

## 2020-01-18 MED ORDER — DEXAMETHASONE SODIUM PHOSPHATE 10 MG/ML IJ SOLN
INTRAMUSCULAR | Status: AC
  Filled 2020-01-18: qty 1

## 2020-01-18 MED ORDER — FENTANYL CITRATE (PF) 100 MCG/2ML IJ SOLN
INTRAMUSCULAR | Status: AC
  Filled 2020-01-18: qty 2

## 2020-01-18 MED ORDER — CELECOXIB 200 MG PO CAPS
ORAL_CAPSULE | ORAL | Status: AC
  Administered 2020-01-18: 08:00:00 200 mg via ORAL
  Filled 2020-01-18: qty 1

## 2020-01-18 MED ORDER — ACETAMINOPHEN 10 MG/ML IV SOLN: 1000.0000 mg | Freq: Once | INTRAVENOUS | Status: DC | PRN

## 2020-01-18 MED ORDER — FENTANYL CITRATE (PF) 100 MCG/2ML IJ SOLN
25.0000 ug | INTRAMUSCULAR | Status: DC | PRN
  Administered 2020-01-18 (×3): 50 ug via INTRAVENOUS

## 2020-01-18 MED ORDER — PROMETHAZINE HCL 25 MG/ML IJ SOLN
6.2500 mg | INTRAMUSCULAR | Status: DC | PRN
  Administered 2020-01-18: 6.25 mg via INTRAVENOUS

## 2020-01-18 MED ORDER — TRAMADOL HCL 50 MG PO TABS: 50.0000 mg | ORAL_TABLET | Freq: Four times a day (QID) | ORAL | 0 refills | Status: AC | PRN

## 2020-01-18 MED ORDER — OXYCODONE HCL 5 MG PO TABS
ORAL_TABLET | ORAL | Status: AC
  Filled 2020-01-18: qty 1

## 2020-01-18 MED ORDER — SODIUM CHLORIDE 0.9 % IR SOLN
Status: DC | PRN
  Administered 2020-01-18: 1000 mL

## 2020-01-18 MED ORDER — ACETAMINOPHEN 160 MG/5ML PO SOLN: 325.0000 mg | Freq: Once | ORAL | Status: DC | PRN

## 2020-01-18 MED ORDER — EPHEDRINE 5 MG/ML INJ
INTRAVENOUS | Status: AC
  Filled 2020-01-18: qty 10

## 2020-01-18 MED ORDER — ACETAMINOPHEN 325 MG PO TABS: 325.0000 mg | ORAL_TABLET | Freq: Once | ORAL | Status: DC | PRN

## 2020-01-18 MED ORDER — STERILE WATER FOR IRRIGATION IR SOLN
Status: DC | PRN
  Administered 2020-01-18: 1000 mL

## 2020-01-18 MED ORDER — MIDAZOLAM HCL 2 MG/2ML IJ SOLN
INTRAMUSCULAR | Status: AC
  Filled 2020-01-18: qty 2

## 2020-01-18 MED ORDER — FENTANYL CITRATE (PF) 250 MCG/5ML IJ SOLN
INTRAMUSCULAR | Status: DC | PRN
  Administered 2020-01-18 (×3): 50 ug via INTRAVENOUS
  Administered 2020-01-18: 100 ug via INTRAVENOUS

## 2020-01-18 MED ORDER — ARTIFICIAL TEARS OPHTHALMIC OINT
TOPICAL_OINTMENT | OPHTHALMIC | Status: AC
  Filled 2020-01-18: qty 3.5

## 2020-01-18 MED ORDER — BUPIVACAINE HCL (PF) 0.25 % IJ SOLN
INTRAMUSCULAR | Status: AC
  Filled 2020-01-18: qty 30

## 2020-01-18 MED ORDER — ROCURONIUM BROMIDE 10 MG/ML (PF) SYRINGE
PREFILLED_SYRINGE | INTRAVENOUS | Status: AC
  Filled 2020-01-18: qty 10

## 2020-01-18 MED ORDER — ONDANSETRON HCL 4 MG/2ML IJ SOLN
INTRAMUSCULAR | Status: DC | PRN
  Administered 2020-01-18: 4 mg via INTRAVENOUS

## 2020-01-18 MED ORDER — LIDOCAINE 2% (20 MG/ML) 5 ML SYRINGE
INTRAMUSCULAR | Status: AC
  Filled 2020-01-18: qty 5

## 2020-01-18 MED ORDER — LACTATED RINGERS IV SOLN: INTRAVENOUS | Status: DC

## 2020-01-18 MED ORDER — CELECOXIB 200 MG PO CAPS: 200.0000 mg | ORAL_CAPSULE | ORAL | Status: AC

## 2020-01-18 MED ORDER — LIDOCAINE 2% (20 MG/ML) 5 ML SYRINGE
INTRAMUSCULAR | Status: DC | PRN
  Administered 2020-01-18: 60 mg via INTRAVENOUS
  Administered 2020-01-18: 140 mg via INTRAVENOUS

## 2020-01-18 MED ORDER — MEPERIDINE HCL 25 MG/ML IJ SOLN: 6.2500 mg | INTRAMUSCULAR | Status: DC | PRN

## 2020-01-18 MED ORDER — PHENYLEPHRINE 40 MCG/ML (10ML) SYRINGE FOR IV PUSH (FOR BLOOD PRESSURE SUPPORT)
PREFILLED_SYRINGE | INTRAVENOUS | Status: AC
  Filled 2020-01-18: qty 10

## 2020-01-18 MED ORDER — MIDAZOLAM HCL 5 MG/5ML IJ SOLN
INTRAMUSCULAR | Status: DC | PRN
  Administered 2020-01-18: 2 mg via INTRAVENOUS

## 2020-01-18 MED ORDER — 0.9 % SODIUM CHLORIDE (POUR BTL) OPTIME
TOPICAL | Status: DC | PRN
  Administered 2020-01-18: 1000 mL

## 2020-01-18 MED ORDER — PROMETHAZINE HCL 25 MG/ML IJ SOLN
INTRAMUSCULAR | Status: AC
  Filled 2020-01-18: qty 1

## 2020-01-18 MED ORDER — BUPIVACAINE HCL (PF) 0.25 % IJ SOLN
INTRAMUSCULAR | Status: DC | PRN
  Administered 2020-01-18: 7 mL

## 2020-01-18 MED ORDER — OXYCODONE HCL 5 MG PO TABS
5.0000 mg | ORAL_TABLET | Freq: Once | ORAL | Status: AC | PRN
  Administered 2020-01-18: 5 mg via ORAL

## 2020-01-18 MED ORDER — FENTANYL CITRATE (PF) 250 MCG/5ML IJ SOLN
INTRAMUSCULAR | Status: AC
  Filled 2020-01-18: qty 5

## 2020-01-18 MED ORDER — PROPOFOL 10 MG/ML IV BOLUS
INTRAVENOUS | Status: AC
  Filled 2020-01-18: qty 20

## 2020-01-18 MED ORDER — CHLORHEXIDINE GLUCONATE CLOTH 2 % EX PADS: 6.0000 | MEDICATED_PAD | Freq: Once | CUTANEOUS | Status: DC

## 2020-01-18 MED ORDER — ACETAMINOPHEN 500 MG PO TABS
ORAL_TABLET | ORAL | Status: AC
  Administered 2020-01-18: 1000 mg via ORAL
  Filled 2020-01-18: qty 2

## 2020-01-18 MED ORDER — ROCURONIUM BROMIDE 10 MG/ML (PF) SYRINGE
PREFILLED_SYRINGE | INTRAVENOUS | Status: DC | PRN
  Administered 2020-01-18: 60 mg via INTRAVENOUS

## 2020-01-18 MED ORDER — SUGAMMADEX SODIUM 200 MG/2ML IV SOLN
INTRAVENOUS | Status: DC | PRN
  Administered 2020-01-18: 50 mg via INTRAVENOUS
  Administered 2020-01-18: 150 mg via INTRAVENOUS

## 2020-01-18 MED ORDER — CEFAZOLIN SODIUM-DEXTROSE 2-4 GM/100ML-% IV SOLN
2.0000 g | INTRAVENOUS | Status: AC
  Administered 2020-01-18: 2 g via INTRAVENOUS

## 2020-01-18 MED ORDER — ACETAMINOPHEN 500 MG PO TABS: 1000.0000 mg | ORAL_TABLET | ORAL | Status: AC

## 2020-01-18 MED ORDER — ONDANSETRON HCL 4 MG/2ML IJ SOLN
INTRAMUSCULAR | Status: AC
  Filled 2020-01-18: qty 2

## 2020-01-18 MED ORDER — DEXAMETHASONE SODIUM PHOSPHATE 10 MG/ML IJ SOLN
INTRAMUSCULAR | Status: DC | PRN
  Administered 2020-01-18: 10 mg via INTRAVENOUS

## 2020-01-18 SURGICAL SUPPLY — 40 items
CANISTER SUCT 3000ML PPV (MISCELLANEOUS) ×3 IMPLANT
CHLORAPREP W/TINT 26 (MISCELLANEOUS) ×3 IMPLANT
CLIP VESOLOCK MED LG 6/CT (CLIP) ×3 IMPLANT
COVER SURGICAL LIGHT HANDLE (MISCELLANEOUS) ×3 IMPLANT
COVER TRANSDUCER ULTRASND (DRAPES) ×3 IMPLANT
COVER WAND RF STERILE (DRAPES) ×3 IMPLANT
DEFOGGER SCOPE WARMER CLEARIFY (MISCELLANEOUS) IMPLANT
DERMABOND ADVANCED (GAUZE/BANDAGES/DRESSINGS) ×2
DERMABOND ADVANCED .7 DNX12 (GAUZE/BANDAGES/DRESSINGS) ×1 IMPLANT
ELECT REM PT RETURN 9FT ADLT (ELECTROSURGICAL) ×3
ELECTRODE REM PT RTRN 9FT ADLT (ELECTROSURGICAL) ×1 IMPLANT
GLOVE BIO SURGEON STRL SZ7.5 (GLOVE) ×3 IMPLANT
GOWN STRL REUS W/ TWL LRG LVL3 (GOWN DISPOSABLE) ×2 IMPLANT
GOWN STRL REUS W/ TWL XL LVL3 (GOWN DISPOSABLE) ×1 IMPLANT
GOWN STRL REUS W/TWL LRG LVL3 (GOWN DISPOSABLE) ×6
GOWN STRL REUS W/TWL XL LVL3 (GOWN DISPOSABLE) ×3
GRASPER SUT TROCAR 14GX15 (MISCELLANEOUS) ×3 IMPLANT
KIT BASIN OR (CUSTOM PROCEDURE TRAY) ×3 IMPLANT
KIT TURNOVER KIT B (KITS) ×3 IMPLANT
NDL INSUFFLATION 14GA 120MM (NEEDLE) ×1 IMPLANT
NEEDLE INSUFFLATION 14GA 120MM (NEEDLE) ×3 IMPLANT
NS IRRIG 1000ML POUR BTL (IV SOLUTION) ×3 IMPLANT
PAD ARMBOARD 7.5X6 YLW CONV (MISCELLANEOUS) ×3 IMPLANT
POUCH LAPAROSCOPIC INSTRUMENT (MISCELLANEOUS) ×3 IMPLANT
POUCH RETRIEVAL ECOSAC 10 (ENDOMECHANICALS) IMPLANT
POUCH RETRIEVAL ECOSAC 10MM (ENDOMECHANICALS)
SCISSORS LAP 5X35 DISP (ENDOMECHANICALS) ×3 IMPLANT
SET IRRIG TUBING LAPAROSCOPIC (IRRIGATION / IRRIGATOR) ×3 IMPLANT
SET TUBE SMOKE EVAC HIGH FLOW (TUBING) ×3 IMPLANT
SLEEVE ENDOPATH XCEL 5M (ENDOMECHANICALS) ×3 IMPLANT
SPECIMEN JAR SMALL (MISCELLANEOUS) ×3 IMPLANT
SUT MNCRL AB 4-0 PS2 18 (SUTURE) ×3 IMPLANT
SUT VIC AB 3-0 SH 27 (SUTURE) ×3
SUT VIC AB 3-0 SH 27XBRD (SUTURE) IMPLANT
TOWEL GREEN STERILE (TOWEL DISPOSABLE) ×3 IMPLANT
TOWEL GREEN STERILE FF (TOWEL DISPOSABLE) ×3 IMPLANT
TRAY LAPAROSCOPIC MC (CUSTOM PROCEDURE TRAY) ×3 IMPLANT
TROCAR XCEL NON-BLD 11X100MML (ENDOMECHANICALS) ×3 IMPLANT
TROCAR XCEL NON-BLD 5MMX100MML (ENDOMECHANICALS) ×3 IMPLANT
WATER STERILE IRR 1000ML POUR (IV SOLUTION) ×3 IMPLANT

## 2020-01-18 NOTE — Interval H&P Note (Signed)
History and Physical Interval Note:  01/18/2020 7:05 AM  Ryan Wilkins  has presented today for surgery, with the diagnosis of BILIARY DYSKINESIA.  The various methods of treatment have been discussed with the patient and family. After consideration of risks, benefits and other options for treatment, the patient has consented to  Procedure(s): LAPAROSCOPIC CHOLECYSTECTOMY (N/A) as a surgical intervention.  The patient's history has been reviewed, patient examined, no change in status, stable for surgery.  I have reviewed the patient's chart and labs.  Questions were answered to the patient's satisfaction.     Axel Filler

## 2020-01-18 NOTE — Discharge Instructions (Signed)
[facility] ______[facility] °LAPAROSCOPIC SURGERY: POST OP INSTRUCTIONS °Always review your discharge instruction sheet given to you by the facility where your surgery was performed. °IF YOU HAVE DISABILITY OR FAMILY LEAVE FORMS, YOU MUST BRING THEM TO THE OFFICE FOR PROCESSING.   °DO NOT GIVE THEM TO YOUR DOCTOR. ° °1. A prescription for pain medication may be given to you upon discharge.  Take your pain medication as prescribed, if needed.  If narcotic pain medicine is not needed, then you may take acetaminophen (Tylenol) or ibuprofen (Advil) as needed. °2. Take your usually prescribed medications unless otherwise directed. °3. If you need a refill on your pain medication, please contact your pharmacy.  They will contact our office to request authorization. Prescriptions will not be filled after 5pm or on week-ends. °4. You should follow a light diet the first few days after arrival home, such as soup and crackers, etc.  Be sure to include lots of fluids daily. °5. Most patients will experience some swelling and bruising in the area of the incisions.  Ice packs will help.  Swelling and bruising can take several days to resolve.  °6. It is common to experience some constipation if taking pain medication after surgery.  Increasing fluid intake and taking a stool softener (such as Colace) will usually help or prevent this problem from occurring.  A mild laxative (Milk of Magnesia or Miralax) should be taken according to package instructions if there are no bowel movements after 48 hours. °7. Unless discharge instructions indicate otherwise, you may remove your bandages 24-48 hours after surgery, and you may shower at that time.  You may have steri-strips (small skin tapes) in place directly over the incision.  These strips should be left on the skin for 7-10 days.  If your surgeon used skin glue on the incision, you may shower in 24 hours.  The glue will flake off over the next 2-3 weeks.  Any sutures or  staples will be removed at the office during your follow-up visit. °8. ACTIVITIES:  You may resume regular (light) daily activities beginning the next day--such as daily self-care, walking, climbing stairs--gradually increasing activities as tolerated.  You may have sexual intercourse when it is comfortable.  Refrain from any heavy lifting or straining until approved by your doctor. °a. You may drive when you are no longer taking prescription pain medication, you can comfortably wear a seatbelt, and you can safely maneuver your car and apply brakes. °b. RETURN TO WORK:  __________________________________________________________ °9. You should see your doctor in the office for a follow-up appointment approximately 2-3 weeks after your surgery.  Make sure that you call for this appointment within a day or two after you arrive home to insure a convenient appointment time. °10. OTHER INSTRUCTIONS: __________________________________________________________________________________________________________________________ __________________________________________________________________________________________________________________________ °WHEN TO CALL YOUR DOCTOR: °1. Fever over 101.0 °2. Inability to urinate °3. Continued bleeding from incision. °4. Increased pain, redness, or drainage from the incision. °5. Increasing abdominal pain ° °The clinic staff is available to answer your questions during regular business hours.  Please don’t hesitate to call and ask to speak to one of the nurses for clinical concerns.  If you have a medical emergency, go to the nearest emergency room or call 911.  A surgeon from [facility] is always on call at the hospital. °[address], [address], [place], [place]  [address] ? [address] [address], [place], [place]   [address] °[phone] ? [phone] ? FAX [phone] °Web site:   [email or web address] °

## 2020-01-18 NOTE — Transfer of Care (Signed)
Immediate Anesthesia Transfer of Care Note  Patient: Ryan Wilkins  Procedure(s) Performed: LAPAROSCOPIC CHOLECYSTECTOMY (N/A Abdomen)  Patient Location: PACU  Anesthesia Type:General  Level of Consciousness: awake, alert  and oriented  Airway & Oxygen Therapy: Patient Spontanous Breathing and Patient connected to face mask oxygen  Post-op Assessment: Report given to RN, Post -op Vital signs reviewed and stable and Patient moving all extremities  Post vital signs: Reviewed and stable  Last Vitals:  Vitals Value Taken Time  BP 186/108 01/18/20 0939  Temp    Pulse 80 01/18/20 0941  Resp 15 01/18/20 0941  SpO2 100 % 01/18/20 0941  Vitals shown include unvalidated device data.  Last Pain:  Vitals:   01/18/20 0729  TempSrc:   PainSc: 2       Patients Stated Pain Goal: 6 (01/18/20 0729)  Complications: No apparent anesthesia complications

## 2020-01-18 NOTE — Op Note (Signed)
01/18/2020  9:21 AM  PATIENT:  Ryan Wilkins  29 y.o. male  PRE-OPERATIVE DIAGNOSIS:  BILIARY DYSKINESIA  POST-OPERATIVE DIAGNOSIS:  BILIARY DYSKINESIA, chronic cholecystitis  PROCEDURE:  Procedure(s): LAPAROSCOPIC CHOLECYSTECTOMY (N/A)  SURGEON:  Surgeon(s) and Role:    * Axel Filler, MD - Primary  ANESTHESIA:   local and general  EBL:  minimal   BLOOD ADMINISTERED:none  DRAINS: none   LOCAL MEDICATIONS USED:  BUPIVICAINE   SPECIMEN:  Source of Specimen:  gallbladder  DISPOSITION OF SPECIMEN:  PATHOLOGY  COUNTS:  YES  TOURNIQUET:  * No tourniquets in log *  DICTATION: .Dragon Dictation  EBL: <5cc   Complications: none   Counts: reported as correct x 2   Findings:chronically inflamed gallbladder  Indications for procedure: Pt is a 37M with RUQ pain and was felt to have biliary dyskenesia.  Details of the procedure: The patient was taken to the operating and placed in the supine position with bilateral SCDs in place. A time out was called and all facts were verified. A pneumoperitoneum was obtained via A Veress needle technique to a pressure of 38mm of mercury. A 73mm trochar was then placed in the right upper quadrant under visualization, and there were no injuries to any abdominal organs. A 11 mm port was then placed in the umbilical region after infiltrating with local anesthesia under direct visualization. A second epigastric port was placed under direct visualization.   The gallbladder was identified and retracted, the peritoneum was then sharply dissected from the gallbladder and this dissection was carried down to Calot's triangle. The cystic duct was identified and dissected circumferentially and seen going into the gallbladder 360.  The cystic artery was dissected away from the surrounding tissues.   The critical angle was obtained.   2 clips were placed proximally one distally and the cystic duct transected. The cystic artery was identified and 2 clips  placed proximally and one distally and transected. We then proceeded to remove the gallbladder off the hepatic fossa with Bovie cautery. A retrieval bag was then placed in the abdomen and gallbladder placed in the bag. The hepatic fossa was then reexamined and hemostasis was achieved with Bovie cautery and was excellent at this portion of the case. The subhepatic fossa and perihepatic fossa was then irrigated until the effluent was clear. The specimen bag and specimen were removed from the abdominal cavity.  The 11 mm trocar fascia was reapproximated with the Endo Close #1 Vicryl x1. The pneumoperitoneum was evacuated and all trochars removed under direct visulalization. The skin was then closed with 4-0 Monocryl and the skin dressed with Dermabond. The patient was awaken from general anesthesia and taken to the recovery room in stable condition.    PLAN OF CARE: Discharge to home after PACU  PATIENT DISPOSITION:  PACU - hemodynamically stable.   Delay start of Pharmacological VTE agent (>24hrs) due to surgical blood loss or risk of bleeding: not applicable

## 2020-01-18 NOTE — Anesthesia Preprocedure Evaluation (Signed)
Anesthesia Evaluation  Patient identified by MRN, date of birth, ID band Patient awake    Reviewed: Allergy & Precautions, NPO status , Patient's Chart, lab work & pertinent test results  Airway Mallampati: I  TM Distance: >3 FB Neck ROM: Full    Dental  (+) Teeth Intact, Dental Advisory Given   Pulmonary Patient abstained from smoking., former smoker,    breath sounds clear to auscultation       Cardiovascular negative cardio ROS   Rhythm:Regular Rate:Normal     Neuro/Psych  Headaches, negative psych ROS   GI/Hepatic negative GI ROS, Neg liver ROS,   Endo/Other  negative endocrine ROS  Renal/GU negative Renal ROS     Musculoskeletal negative musculoskeletal ROS (+)   Abdominal Normal abdominal exam  (+)   Peds  Hematology negative hematology ROS (+)   Anesthesia Other Findings   Reproductive/Obstetrics                             Anesthesia Physical Anesthesia Plan  ASA: II  Anesthesia Plan: General   Post-op Pain Management:    Induction: Intravenous  PONV Risk Score and Plan: 3 and Ondansetron, Dexamethasone and Midazolam  Airway Management Planned: Oral ETT  Additional Equipment: None  Intra-op Plan:   Post-operative Plan: Extubation in OR  Informed Consent: I have reviewed the patients History and Physical, chart, labs and discussed the procedure including the risks, benefits and alternatives for the proposed anesthesia with the patient or authorized representative who has indicated his/her understanding and acceptance.     Dental advisory given  Plan Discussed with: CRNA  Anesthesia Plan Comments:         Anesthesia Quick Evaluation

## 2020-01-18 NOTE — Anesthesia Procedure Notes (Signed)
Procedure Name: Intubation Date/Time: 01/18/2020 8:48 AM Performed by: Ezekiel Ina, CRNA Pre-anesthesia Checklist: Patient identified, Emergency Drugs available, Suction available and Patient being monitored Patient Re-evaluated:Patient Re-evaluated prior to induction Oxygen Delivery Method: Circle System Utilized Preoxygenation: Pre-oxygenation with 100% oxygen Induction Type: IV induction Ventilation: Mask ventilation without difficulty Laryngoscope Size: Miller and 3 Grade View: Grade I Tube type: Oral Tube size: 7.5 mm Number of attempts: 1 Airway Equipment and Method: Stylet Placement Confirmation: ETT inserted through vocal cords under direct vision,  positive ETCO2 and breath sounds checked- equal and bilateral Secured at: 23 cm Tube secured with: Tape Dental Injury: Teeth and Oropharynx as per pre-operative assessment  Comments: Performed by [place] Ingram SRNA

## 2020-01-19 ENCOUNTER — Encounter: Payer: Self-pay | Admitting: *Deleted

## 2020-01-19 LAB — SURGICAL PATHOLOGY

## 2020-01-19 NOTE — Anesthesia Postprocedure Evaluation (Signed)
Anesthesia Post Note  Patient: Ryan Wilkins  Procedure(s) Performed: LAPAROSCOPIC CHOLECYSTECTOMY (N/A Abdomen)     Patient location during evaluation: PACU Anesthesia Type: General Level of consciousness: awake and alert Pain management: pain level controlled Vital Signs Assessment: post-procedure vital signs reviewed and stable Respiratory status: spontaneous breathing, nonlabored ventilation, respiratory function stable and patient connected to nasal cannula oxygen Cardiovascular status: blood pressure returned to baseline and stable Postop Assessment: no apparent nausea or vomiting Anesthetic complications: no    Last Vitals:  Vitals:   01/18/20 0955 01/18/20 1010  BP: (!) 152/87 (!) 151/83  Pulse: 65 65  Resp: 12 16  Temp:  36.6 C  SpO2: 99% 99%    Last Pain:  Vitals:   01/18/20 1010  TempSrc:   PainSc: Asleep                 Shelton Silvas

## 2020-02-03 ENCOUNTER — Ambulatory Visit: Payer: BC Managed Care – PPO | Admitting: Gastroenterology

## 2020-11-07 DIAGNOSIS — R509 Fever, unspecified: Secondary | ICD-10-CM | POA: Diagnosis not present

## 2020-11-07 DIAGNOSIS — U071 COVID-19: Secondary | ICD-10-CM | POA: Diagnosis not present

## 2021-01-19 IMAGING — US US ABDOMEN LIMITED
1 series · 14 of 25 positions shown · non-contrast
Comparison: None.

CLINICAL DATA: Right upper quadrant pain

EXAM:
ULTRASOUND ABDOMEN LIMITED RIGHT UPPER QUADRANT

[Series 1: us abdomen limited · 14 of 30 slices shown]
[im 1/30]
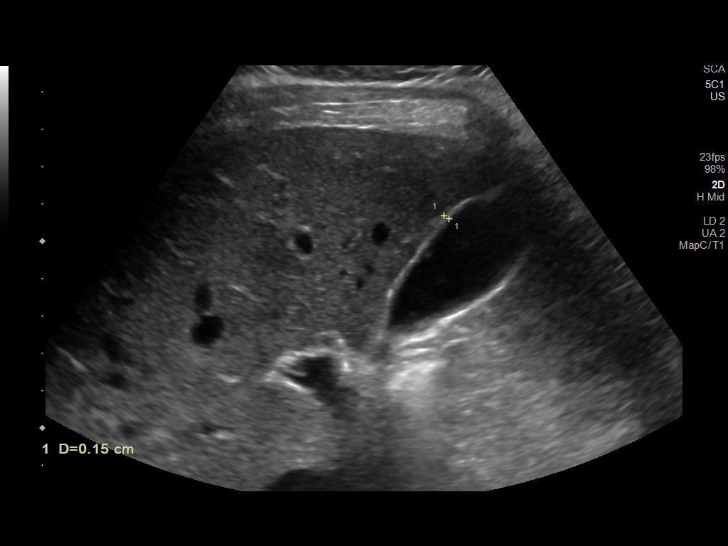
[im 3/30]
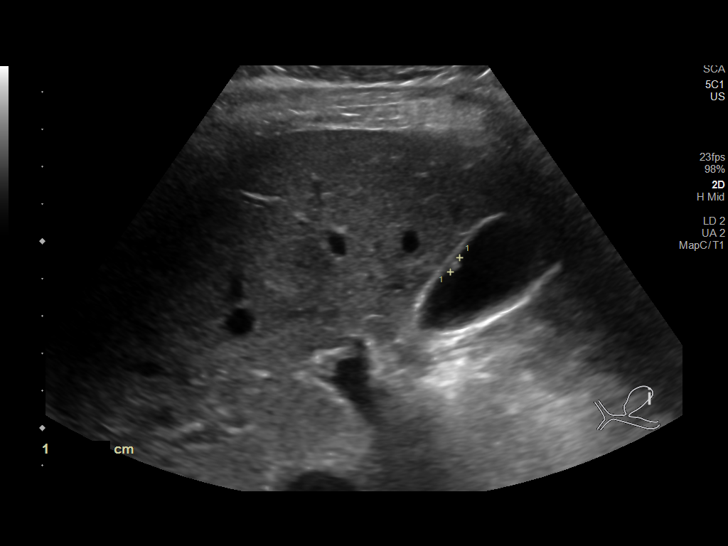
[im 5/30]
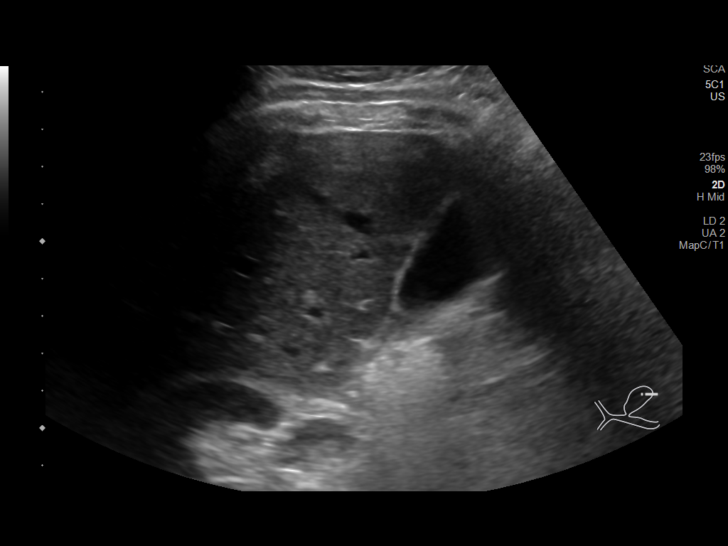
[im 8/30]
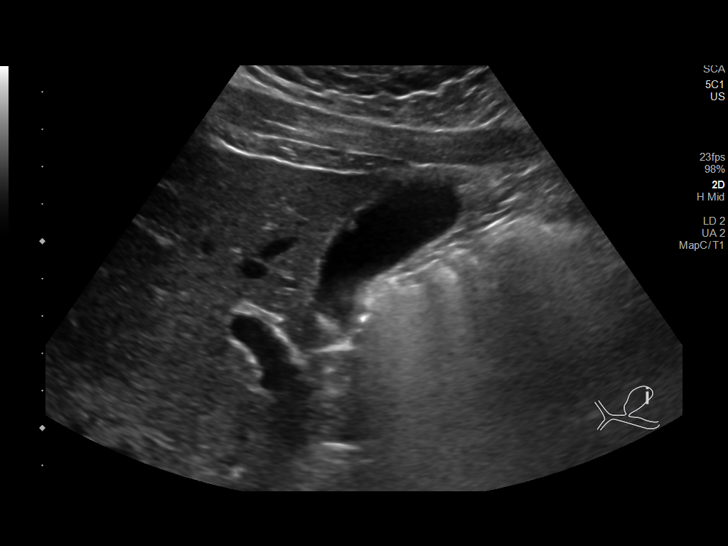
[im 10/30]
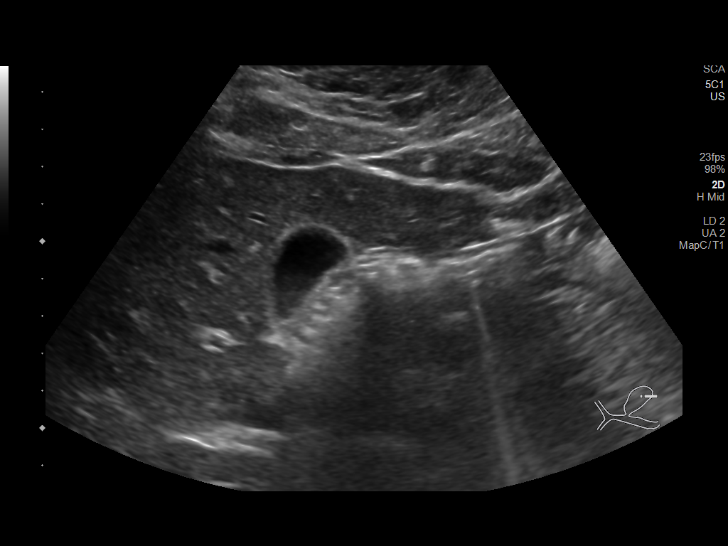
[im 11/30]
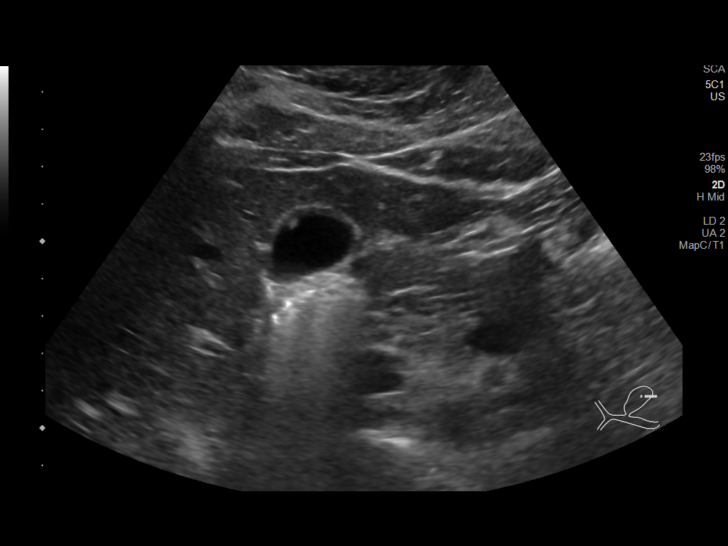
[im 14/30]
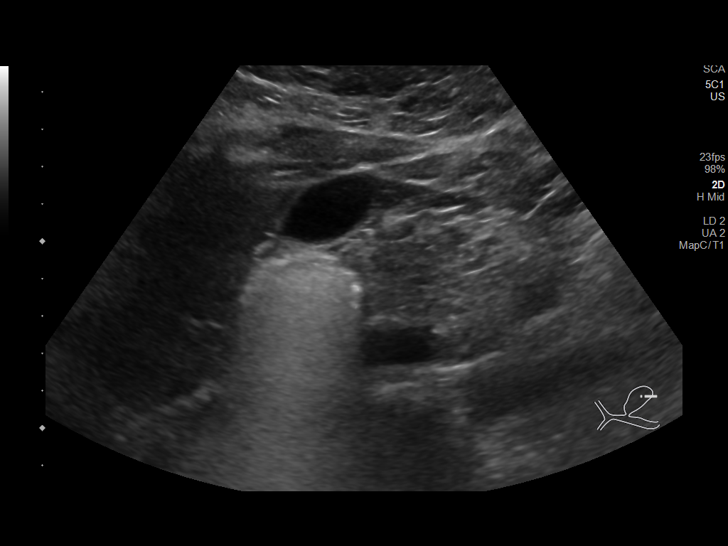
[im 16/30]
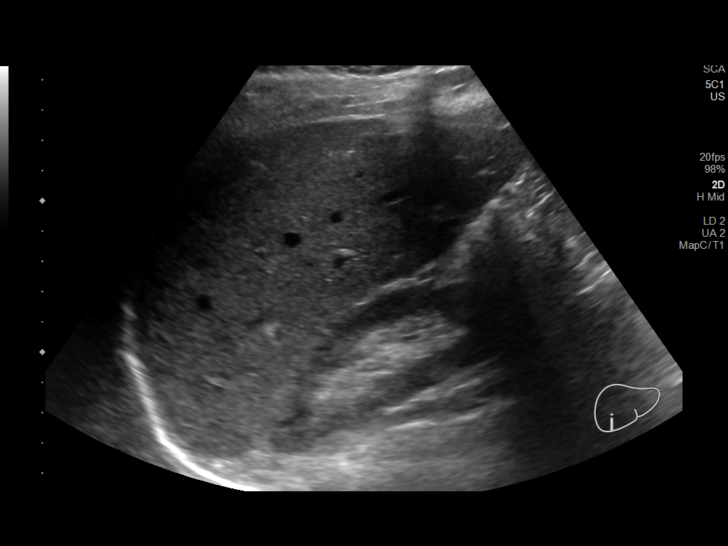
[im 19/30]
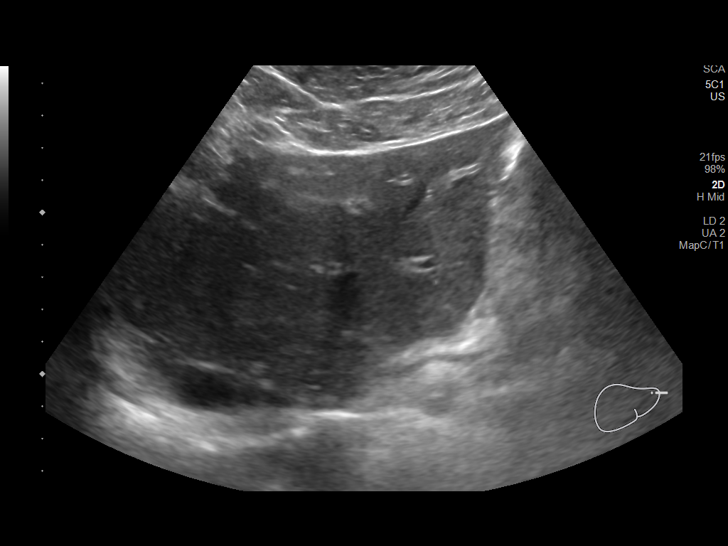
[im 20/30]
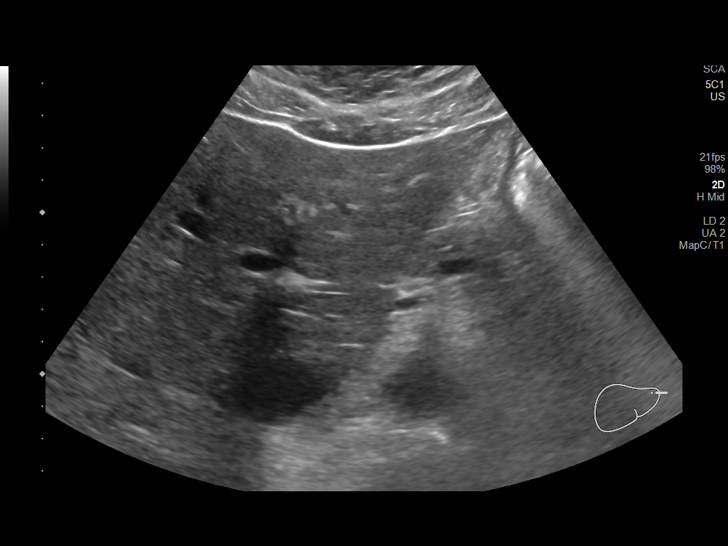
[im 22/30]
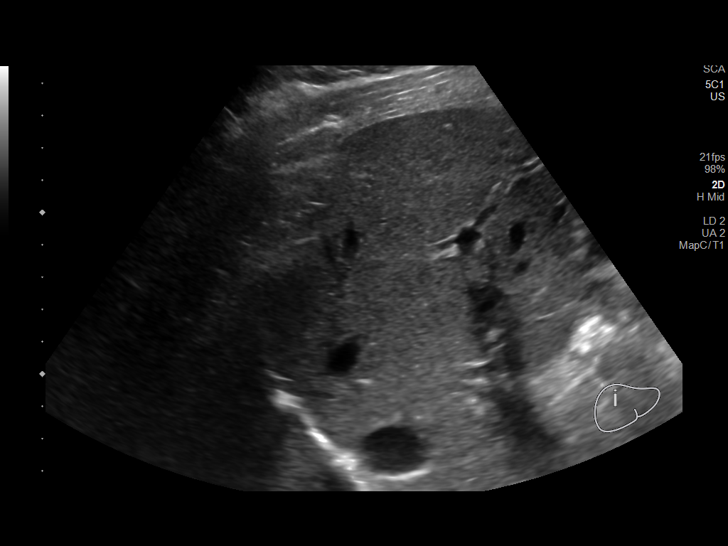
[im 25/30]
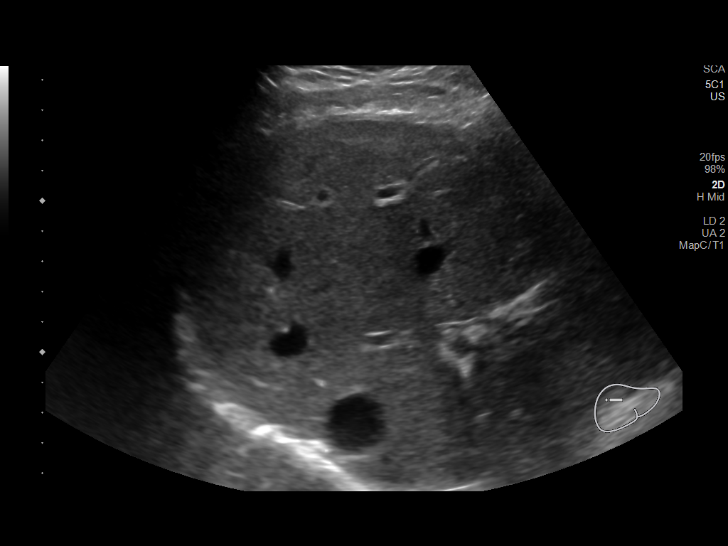
[im 27/30]
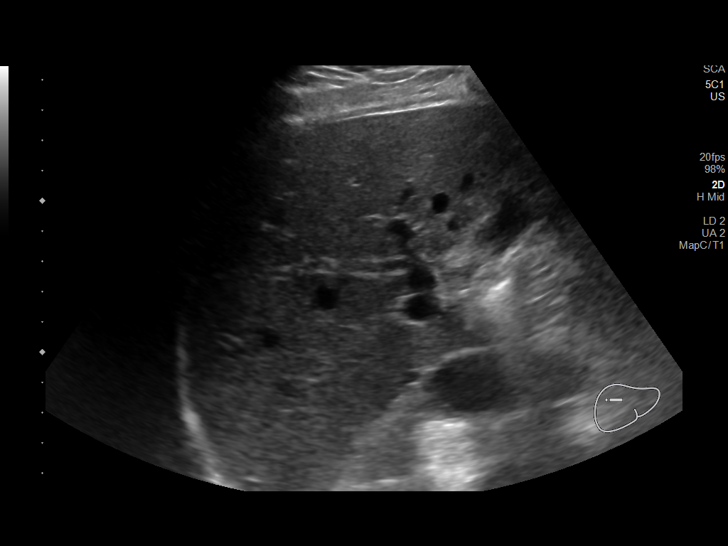
[im 30/30]
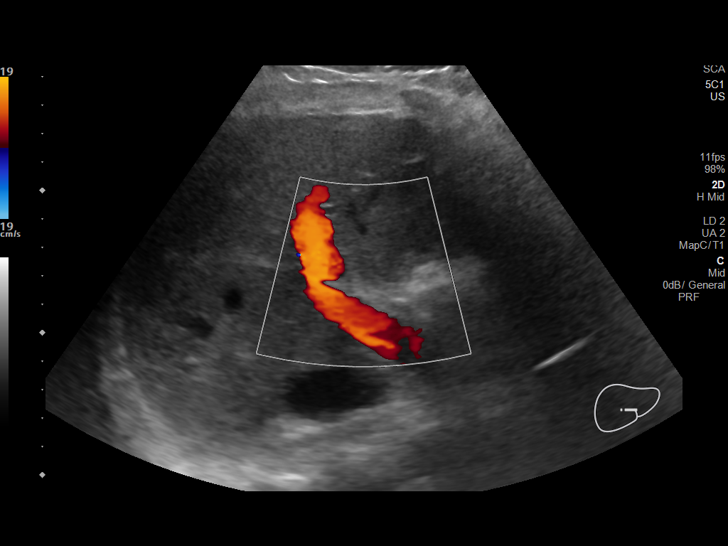

[14 of 25 positions shown; findings below may reference images not displayed]

FINDINGS: Gallbladder:

4.5 mm gallbladder polyp. No gallstone or gallbladder wall
thickening. Positive sonographic Murphy sign

Common bile duct:

Diameter: 1.7 mm

Liver:

No focal lesion identified. Within normal limits in parenchymal
echogenicity. Portal vein is patent on color Doppler imaging with
normal direction of blood flow towards the liver.

Other: None.
IMPRESSION: The patient is tender over the gallbladder. There is no gallbladder
wall thickening or gallstones. There is a small gallbladder polyp.

## 2021-03-27 DIAGNOSIS — H66002 Acute suppurative otitis media without spontaneous rupture of ear drum, left ear: Secondary | ICD-10-CM | POA: Diagnosis not present

## 2022-12-21 DIAGNOSIS — N132 Hydronephrosis with renal and ureteral calculous obstruction: Secondary | ICD-10-CM | POA: Diagnosis not present

## 2022-12-21 DIAGNOSIS — R1031 Right lower quadrant pain: Secondary | ICD-10-CM | POA: Diagnosis not present

## 2022-12-21 DIAGNOSIS — R110 Nausea: Secondary | ICD-10-CM | POA: Diagnosis not present

## 2022-12-21 DIAGNOSIS — R300 Dysuria: Secondary | ICD-10-CM | POA: Diagnosis not present

## 2022-12-21 DIAGNOSIS — F17210 Nicotine dependence, cigarettes, uncomplicated: Secondary | ICD-10-CM | POA: Diagnosis not present

## 2022-12-21 DIAGNOSIS — R944 Abnormal results of kidney function studies: Secondary | ICD-10-CM | POA: Diagnosis not present

## 2023-02-11 DIAGNOSIS — N200 Calculus of kidney: Secondary | ICD-10-CM | POA: Diagnosis not present

## 2023-02-11 DIAGNOSIS — Z1330 Encounter for screening examination for mental health and behavioral disorders, unspecified: Secondary | ICD-10-CM | POA: Diagnosis not present

## 2023-05-07 DIAGNOSIS — R194 Change in bowel habit: Secondary | ICD-10-CM | POA: Diagnosis not present

## 2023-05-07 DIAGNOSIS — K602 Anal fissure, unspecified: Secondary | ICD-10-CM | POA: Diagnosis not present

## 2023-05-07 DIAGNOSIS — R197 Diarrhea, unspecified: Secondary | ICD-10-CM | POA: Diagnosis not present

## 2023-06-04 DIAGNOSIS — K602 Anal fissure, unspecified: Secondary | ICD-10-CM | POA: Diagnosis not present

## 2023-07-19 DIAGNOSIS — R0981 Nasal congestion: Secondary | ICD-10-CM | POA: Diagnosis not present

## 2023-07-19 DIAGNOSIS — H9202 Otalgia, left ear: Secondary | ICD-10-CM | POA: Diagnosis not present

## 2023-07-19 DIAGNOSIS — F17290 Nicotine dependence, other tobacco product, uncomplicated: Secondary | ICD-10-CM | POA: Diagnosis not present

## 2023-07-19 DIAGNOSIS — J029 Acute pharyngitis, unspecified: Secondary | ICD-10-CM | POA: Diagnosis not present

## 2023-07-19 DIAGNOSIS — Z20822 Contact with and (suspected) exposure to covid-19: Secondary | ICD-10-CM | POA: Diagnosis not present

## 2023-07-19 DIAGNOSIS — R0982 Postnasal drip: Secondary | ICD-10-CM | POA: Diagnosis not present

## 2023-07-19 DIAGNOSIS — H1032 Unspecified acute conjunctivitis, left eye: Secondary | ICD-10-CM | POA: Diagnosis not present
